# Patient Record
Sex: Female | Born: 1990 | Race: Black or African American | Hispanic: No | Smoking: Never smoker
Health system: Southern US, Community
[De-identification: ages and names within clinical notes are randomized; demographics above are authoritative.]

## PROBLEM LIST (undated history)

## (undated) DIAGNOSIS — J45909 Unspecified asthma, uncomplicated: Secondary | ICD-10-CM

## (undated) DIAGNOSIS — F419 Anxiety disorder, unspecified: Secondary | ICD-10-CM

---

## 2020-07-16 ENCOUNTER — Emergency Department: Payer: No Typology Code available for payment source

## 2020-07-16 ENCOUNTER — Other Ambulatory Visit: Payer: Self-pay

## 2020-07-16 ENCOUNTER — Emergency Department
Admission: EM | Admit: 2020-07-16 | Discharge: 2020-07-16 | Disposition: A | Discharge status: Home / Self Care | Payer: No Typology Code available for payment source | Attending: Student in an Organized Health Care Education/Training Program | Admitting: Student in an Organized Health Care Education/Training Program

## 2020-07-16 ENCOUNTER — Encounter: Payer: Self-pay | Admitting: Emergency Medicine

## 2020-07-16 DIAGNOSIS — W231XXA Caught, crushed, jammed, or pinched between stationary objects, initial encounter: Secondary | ICD-10-CM | POA: Diagnosis not present

## 2020-07-16 DIAGNOSIS — S59911A Unspecified injury of right forearm, initial encounter: Secondary | ICD-10-CM | POA: Diagnosis present

## 2020-07-16 DIAGNOSIS — J45909 Unspecified asthma, uncomplicated: Secondary | ICD-10-CM | POA: Diagnosis not present

## 2020-07-16 DIAGNOSIS — Z008 Encounter for other general examination: Secondary | ICD-10-CM

## 2020-07-16 DIAGNOSIS — S5011XA Contusion of right forearm, initial encounter: Secondary | ICD-10-CM | POA: Insufficient documentation

## 2020-07-16 DIAGNOSIS — Y92810 Car as the place of occurrence of the external cause: Secondary | ICD-10-CM | POA: Diagnosis not present

## 2020-07-16 HISTORY — DX: Anxiety disorder, unspecified: F41.9

## 2020-07-16 HISTORY — DX: Unspecified asthma, uncomplicated: J45.909

## 2020-07-16 MED ORDER — NAPROXEN 500 MG PO TABS
500.0000 mg | ORAL_TABLET | Freq: Once | ORAL | Status: AC
  Administered 2020-07-16: 500 mg via ORAL
  Filled 2020-07-16: qty 1

## 2020-07-16 MED ORDER — LIDOCAINE 5 % EX PTCH
1.0000 | MEDICATED_PATCH | CUTANEOUS | Status: DC
  Administered 2020-07-16: 1 via TRANSDERMAL
  Filled 2020-07-16: qty 1

## 2020-07-16 NOTE — Discharge Instructions (Signed)
Wear arm sling for 2 to 3 days as needed.  Advised over-the-counter ibuprofen as needed for pain/swelling.

## 2020-07-16 NOTE — ED Notes (Signed)
Patient c/o increased swelling to distal forearm/wrist area. Will inform PA-C.
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
## 2020-07-16 NOTE — ED Notes (Signed)
Patient also c/o left forearm pain. Arlyss Queen PA-C informed.
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
